# Patient Record
Sex: Male | Born: 1995 | Race: Black or African American | Hispanic: No | Marital: Single | Smoking: Never smoker
Health system: Southern US, Community
[De-identification: ages and names within clinical notes are randomized; demographics above are authoritative.]

## PROBLEM LIST (undated history)

## (undated) DIAGNOSIS — J45909 Unspecified asthma, uncomplicated: Secondary | ICD-10-CM

## (undated) HISTORY — PX: ABDOMINAL SURGERY: SHX537

---

## 2020-05-06 ENCOUNTER — Emergency Department (HOSPITAL_COMMUNITY)
Admission: EM | Admit: 2020-05-06 | Discharge: 2020-05-06 | Disposition: A | Discharge status: Home / Self Care | Payer: BC Managed Care – PPO | Attending: Emergency Medicine | Admitting: Emergency Medicine

## 2020-05-06 ENCOUNTER — Emergency Department (HOSPITAL_COMMUNITY): Payer: BC Managed Care – PPO

## 2020-05-06 ENCOUNTER — Encounter (HOSPITAL_COMMUNITY): Payer: Self-pay | Admitting: Emergency Medicine

## 2020-05-06 DIAGNOSIS — M545 Low back pain: Secondary | ICD-10-CM | POA: Insufficient documentation

## 2020-05-06 DIAGNOSIS — M542 Cervicalgia: Secondary | ICD-10-CM | POA: Diagnosis present

## 2020-05-06 DIAGNOSIS — M25562 Pain in left knee: Secondary | ICD-10-CM | POA: Insufficient documentation

## 2020-05-06 MED ORDER — KETOROLAC TROMETHAMINE 15 MG/ML IJ SOLN
15.0000 mg | Freq: Once | INTRAMUSCULAR | Status: AC
  Administered 2020-05-06: 15 mg via INTRAVENOUS
  Filled 2020-05-06: qty 1
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
## 2020-05-06 NOTE — ED Notes (Signed)
Charge RN made aware that family members upset they could not switch out to visit patient. Patient and family had been informed of visitor policy by myself and security. SO remained at bedside.
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
## 2020-07-21 ENCOUNTER — Ambulatory Visit (HOSPITAL_COMMUNITY)
Admission: EM | Admit: 2020-07-21 | Discharge: 2020-07-21 | Disposition: A | Discharge status: Home / Self Care | Payer: BC Managed Care – PPO | Attending: Family Medicine | Admitting: Family Medicine

## 2020-07-21 ENCOUNTER — Encounter (HOSPITAL_COMMUNITY): Payer: Self-pay | Admitting: Family Medicine

## 2020-07-21 ENCOUNTER — Other Ambulatory Visit: Payer: Self-pay

## 2020-07-21 DIAGNOSIS — S161XXA Strain of muscle, fascia and tendon at neck level, initial encounter: Secondary | ICD-10-CM

## 2020-07-21 MED ORDER — CYCLOBENZAPRINE HCL 10 MG PO TABS
ORAL_TABLET | ORAL | 0 refills | Status: DC
Start: 2020-07-21 — End: 2021-08-08

## 2020-07-21 MED ORDER — DICLOFENAC SODIUM 75 MG PO TBEC
75.0000 mg | DELAYED_RELEASE_TABLET | Freq: Two times a day (BID) | ORAL | 0 refills | Status: AC
Start: 2020-07-21 — End: ?
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
## 2021-08-07 ENCOUNTER — Emergency Department (HOSPITAL_COMMUNITY): Payer: BC Managed Care – PPO

## 2021-08-07 ENCOUNTER — Emergency Department (HOSPITAL_COMMUNITY)
Admission: EM | Admit: 2021-08-07 | Discharge: 2021-08-08 | Disposition: A | Discharge status: Home / Self Care | Payer: BC Managed Care – PPO | Attending: Physician Assistant | Admitting: Physician Assistant

## 2021-08-07 DIAGNOSIS — Z5321 Procedure and treatment not carried out due to patient leaving prior to being seen by health care provider: Secondary | ICD-10-CM | POA: Insufficient documentation

## 2021-08-07 DIAGNOSIS — R079 Chest pain, unspecified: Secondary | ICD-10-CM | POA: Insufficient documentation

## 2021-08-07 DIAGNOSIS — M25531 Pain in right wrist: Secondary | ICD-10-CM | POA: Insufficient documentation

## 2021-08-07 LAB — BASIC METABOLIC PANEL
Anion gap: 8 (ref 5–15)
BUN: 8 mg/dL (ref 6–20)
CO2: 28 mmol/L (ref 22–32)
Calcium: 9 mg/dL (ref 8.9–10.3)
Chloride: 102 mmol/L (ref 98–111)
Creatinine, Ser: 0.91 mg/dL (ref 0.61–1.24)
GFR, Estimated: >60 mL/min (ref >60)
Glucose, Bld: 68 mg/dL — ABNORMAL LOW (ref 70–99)
Potassium: 3.7 mmol/L (ref 3.5–5.1)
Sodium: 138 mmol/L (ref 135–145)

## 2021-08-07 LAB — CBC
HCT: 47.6 % (ref 39.0–52.0)
Hemoglobin: 15 g/dL (ref 13.0–17.0)
MCH: 26.4 pg (ref 26.0–34.0)
MCHC: 31.5 g/dL (ref 30.0–36.0)
MCV: 83.8 fL (ref 80.0–100.0)
Platelets: 206 10*3/uL (ref 150–400)
RBC: 5.68 MIL/uL (ref 4.22–5.81)
RDW: 12.7 % (ref 11.5–15.5)
WBC: 4.7 10*3/uL (ref 4.0–10.5)
nRBC: 0 % (ref 0.0–0.2)

## 2021-08-07 LAB — TROPONIN I (HIGH SENSITIVITY): Troponin I (High Sensitivity): 2 ng/L (ref <18)

## 2021-08-07 NOTE — ED Provider Notes (Signed)
Emergency Medicine Provider Triage Evaluation Note  Leonard Davis , a 25 y.o. male  was evaluated in triage.  Pt complains of chest pain that occurred while working yesterday. He is also c/o left arm pain and right wrist pain.  Review of Systems  Positive: Chest pain, left arm pain, right wrist pain Negative: sob  Physical Exam  BP 132/78 (BP Location: Right Arm)   Pulse (!) 59   Temp 98.4 F (36.9 C) (Oral)   Resp 18   SpO2 100%  Gen:   Awake, no distress   Resp:  Normal effort  MSK:   Moves extremities without difficulty   Medical Decision Making  Medically screening exam initiated at 8:50 PM.  Appropriate orders placed.  Leonard Davis was informed that the remainder of the evaluation will be completed by another provider, this initial triage assessment does not replace that evaluation, and the importance of remaining in the ED until their evaluation is complete.     Samson Frederic, Thomasina Housley S, PA-C 08/07/21 2050    Melene Plan, DO 08/07/21 2247

## 2021-08-07 NOTE — ED Triage Notes (Signed)
Pt c/o R wrist pain x "awhile," worse w movement. L bicep x1 day, cramping & "still feels tight today." CP yesterday, "wanted to bring it up," sharp pain x "a couple minutes, then faded."

## 2021-08-08 ENCOUNTER — Encounter (HOSPITAL_COMMUNITY): Payer: Self-pay

## 2021-08-08 ENCOUNTER — Ambulatory Visit (HOSPITAL_COMMUNITY)
Admission: EM | Admit: 2021-08-08 | Discharge: 2021-08-08 | Disposition: A | Discharge status: Home / Self Care | Payer: BC Managed Care – PPO | Attending: Family Medicine | Admitting: Family Medicine

## 2021-08-08 DIAGNOSIS — R079 Chest pain, unspecified: Secondary | ICD-10-CM | POA: Diagnosis not present

## 2021-08-08 DIAGNOSIS — R252 Cramp and spasm: Secondary | ICD-10-CM

## 2021-08-08 HISTORY — DX: Unspecified asthma, uncomplicated: J45.909

## 2021-08-08 LAB — TROPONIN I (HIGH SENSITIVITY): Troponin I (High Sensitivity): 3 ng/L (ref <18)

## 2021-08-08 MED ORDER — CYCLOBENZAPRINE HCL 5 MG PO TABS: 5.0000 mg | ORAL_TABLET | Freq: Every evening | ORAL | 0 refills | Status: AC | PRN

## 2021-08-08 MED ORDER — PREDNISONE 20 MG PO TABS: 20.0000 mg | ORAL_TABLET | Freq: Every day | ORAL | 0 refills | Status: AC

## 2021-08-08 NOTE — ED Provider Notes (Signed)
[facility]    CSN: [id number] Arrival date & time: 08/08/21  1329      History   Chief Complaint Chief Complaint  Patient presents with   Muscle Pain    HPI Leonard Davis is a 25 y.o. male.   Patient presenting today with 2-day history of left bicep cramping now with radiating pain up into left pectoral muscle.  Also having right wrist pain for the past 2 months.  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  Denies swelling, discoloration, numbness, tingling, weakness, shortness of breath, wheezing, headaches, dizziness, palpitations.  Has not been tried anything over-the-counter for symptoms so far.  Went to the emergency department yesterday, was seen in triage and EKG, chest x-ray and labs done but he ultimately left without being seen.   Past Medical History:  Diagnosis Date   Asthma     There are no problems to display for this patient.   Past Surgical History:  Procedure Laterality Date   ABDOMINAL SURGERY        Home Medications    Prior to Admission medications   Medication Sig Start Date End Date Taking? Authorizing Provider  cyclobenzaprine (FLEXERIL) 5 MG tablet Take 1 tablet (5 mg total) by mouth at bedtime as needed for muscle spasms. 08/08/21  Yes Particia Nearing, PA-C  predniSONE (DELTASONE) 20 MG tablet Take 1 tablet (20 mg total) by mouth daily with breakfast. 08/08/21  Yes Particia Nearing, PA-C  diclofenac (VOLTAREN) 75 MG EC tablet Take 1 tablet (75 mg total) by mouth 2 (two) times daily. 07/21/20   Mardella Layman, MD    Family History Family History  Problem Relation Age of Onset   Hypertension Mother    Healthy Father     Social History Social History   Tobacco Use   Smoking status: Never   Smokeless tobacco: Never  Vaping Use   Vaping Use: Some days   Substances: THC, Flavoring  Substance Use Topics   Alcohol use: Yes    Comment: Socially   Drug use: Yes    Types:  Marijuana    Comment: socially     Allergies   Patient has no known allergies.   Review of Systems Review of Systems Per HPI  Physical Exam Triage Vital Signs ED Triage Vitals  Enc Vitals Group     BP 08/08/21 1529 136/79     Pulse Rate 08/08/21 1529 69     Resp 08/08/21 1529 18     Temp 08/08/21 1529 97.9 F (36.6 C)     Temp Source 08/08/21 1529 Oral     SpO2 08/08/21 1529 97 %     Weight --      Height --      Head Circumference --      Peak Flow --      Pain Score 08/08/21 1525 0     Pain Loc --      Pain Edu? --      Excl. in GC? --    No data found.  Updated Vital Signs BP 136/79 (BP Location: Left Arm)   Pulse 69   Temp 97.9 F (36.6 C) (Oral)   Resp 18   SpO2 97%   Visual Acuity Right Eye Distance:   Left Eye Distance:   Bilateral Distance:    Right Eye Near:   Left Eye Near:    Bilateral Near:     Physical Exam  Vitals and nursing note reviewed.  Constitutional:      Appearance: Normal appearance.  HENT:     Head: Atraumatic.     Mouth/Throat:     Mouth: Mucous membranes are moist.  Eyes:     Extraocular Movements: Extraocular movements intact.     Conjunctiva/sclera: Conjunctivae normal.  Cardiovascular:     Rate and Rhythm: Normal rate and regular rhythm.  Pulmonary:     Effort: Pulmonary effort is normal.     Breath sounds: Normal breath sounds. No wheezing or rales.  Abdominal:     General: Bowel sounds are normal.     Palpations: Abdomen is soft.  Musculoskeletal:        General: Tenderness present. No swelling or deformity. Normal range of motion.     Cervical back: Normal range of motion and neck supple.     Comments: Tender to palpation left bicep region extending into left pectoral muscle, worse with range of motion against resistance.  Tenderness palpation flexor surface of right the range of motion full and intact in right wrist and hand, grip strength full and equal bilateral hands  Skin:    General: Skin is warm and dry.      Findings: No bruising or erythema.  Neurological:     General: No focal deficit present.     Mental Status: He is oriented to person, place, and time.     Motor: No weakness.     Gait: Gait normal.     Comments: Bilateral upper extremities neurovascular intact  Psychiatric:        Mood and Affect: Mood normal.        Thought Content: Thought content normal.        Judgment: Judgment normal.     UC Treatments / Results  Labs (all labs ordered are listed, but only abnormal results are displayed) Labs Reviewed - No data to display  EKG   Radiology DG Chest 2 View  Result Date: 08/07/2021 CLINICAL DATA:  Chest pain. EXAM: CHEST - 2 VIEW COMPARISON:  None. FINDINGS: The heart size and mediastinal contours are within normal limits. Both lungs are clear. The visualized skeletal structures are unremarkable. IMPRESSION: No active cardiopulmonary disease. Electronically Signed   By: Lupita Raider M.D.   On: 08/07/2021 21:34    Procedures Procedures (including critical care time)  Medications Ordered in UC Medications - No data to display  Initial Impression / Assessment and Plan / UC Course  I have reviewed the triage vital signs and the nursing notes.  Pertinent labs & imaging results that were available during my care of the patient were reviewed by me and considered in my medical decision making (see chart for details).     Consistent with muscular strains.  We will treat with prednisone, Flexeril, stretches, massage.  Work note given for rest.  Labs, chest x-ray, EKG all reviewed from the emergency department triage yesterday and all reassuring without significant abnormal findings.  Final Clinical Impressions(s) / UC Diagnoses   Final diagnoses:  Muscle cramping  Chest pain, unspecified type   Discharge Instructions   None    ED Prescriptions     Medication Sig Dispense Auth. Provider   predniSONE (DELTASONE) 20 MG tablet Take 1 tablet (20 mg total) by mouth  daily with breakfast. 10 tablet Particia Nearing, PA-C   cyclobenzaprine (FLEXERIL) 5 MG tablet Take 1 tablet (5 mg total) by mouth at bedtime as needed for muscle spasms. 10 tablet Maurice March,  Salley Hews, PA-C      PDMP not reviewed this encounter.   Particia Nearing, [place] 08/08/21 2118

## 2021-08-08 NOTE — ED Triage Notes (Signed)
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  Cramping went up into L chest wall.  Lasted approx 20 seconds.  Was hard to bend arm again for a while without the cramping starting again.  Also reports R wrist pain x 2 mos.  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

## 2021-08-08 NOTE — ED Notes (Signed)
PT LEFT DUE TO WAIT TIME 
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
## 2023-02-12 IMAGING — DX DG CHEST 2V
2 series · 2 of 2 positions shown · non-contrast
Comparison: None.

CLINICAL DATA: Chest pain.

EXAM:
CHEST - 2 VIEW

[w chest pa]
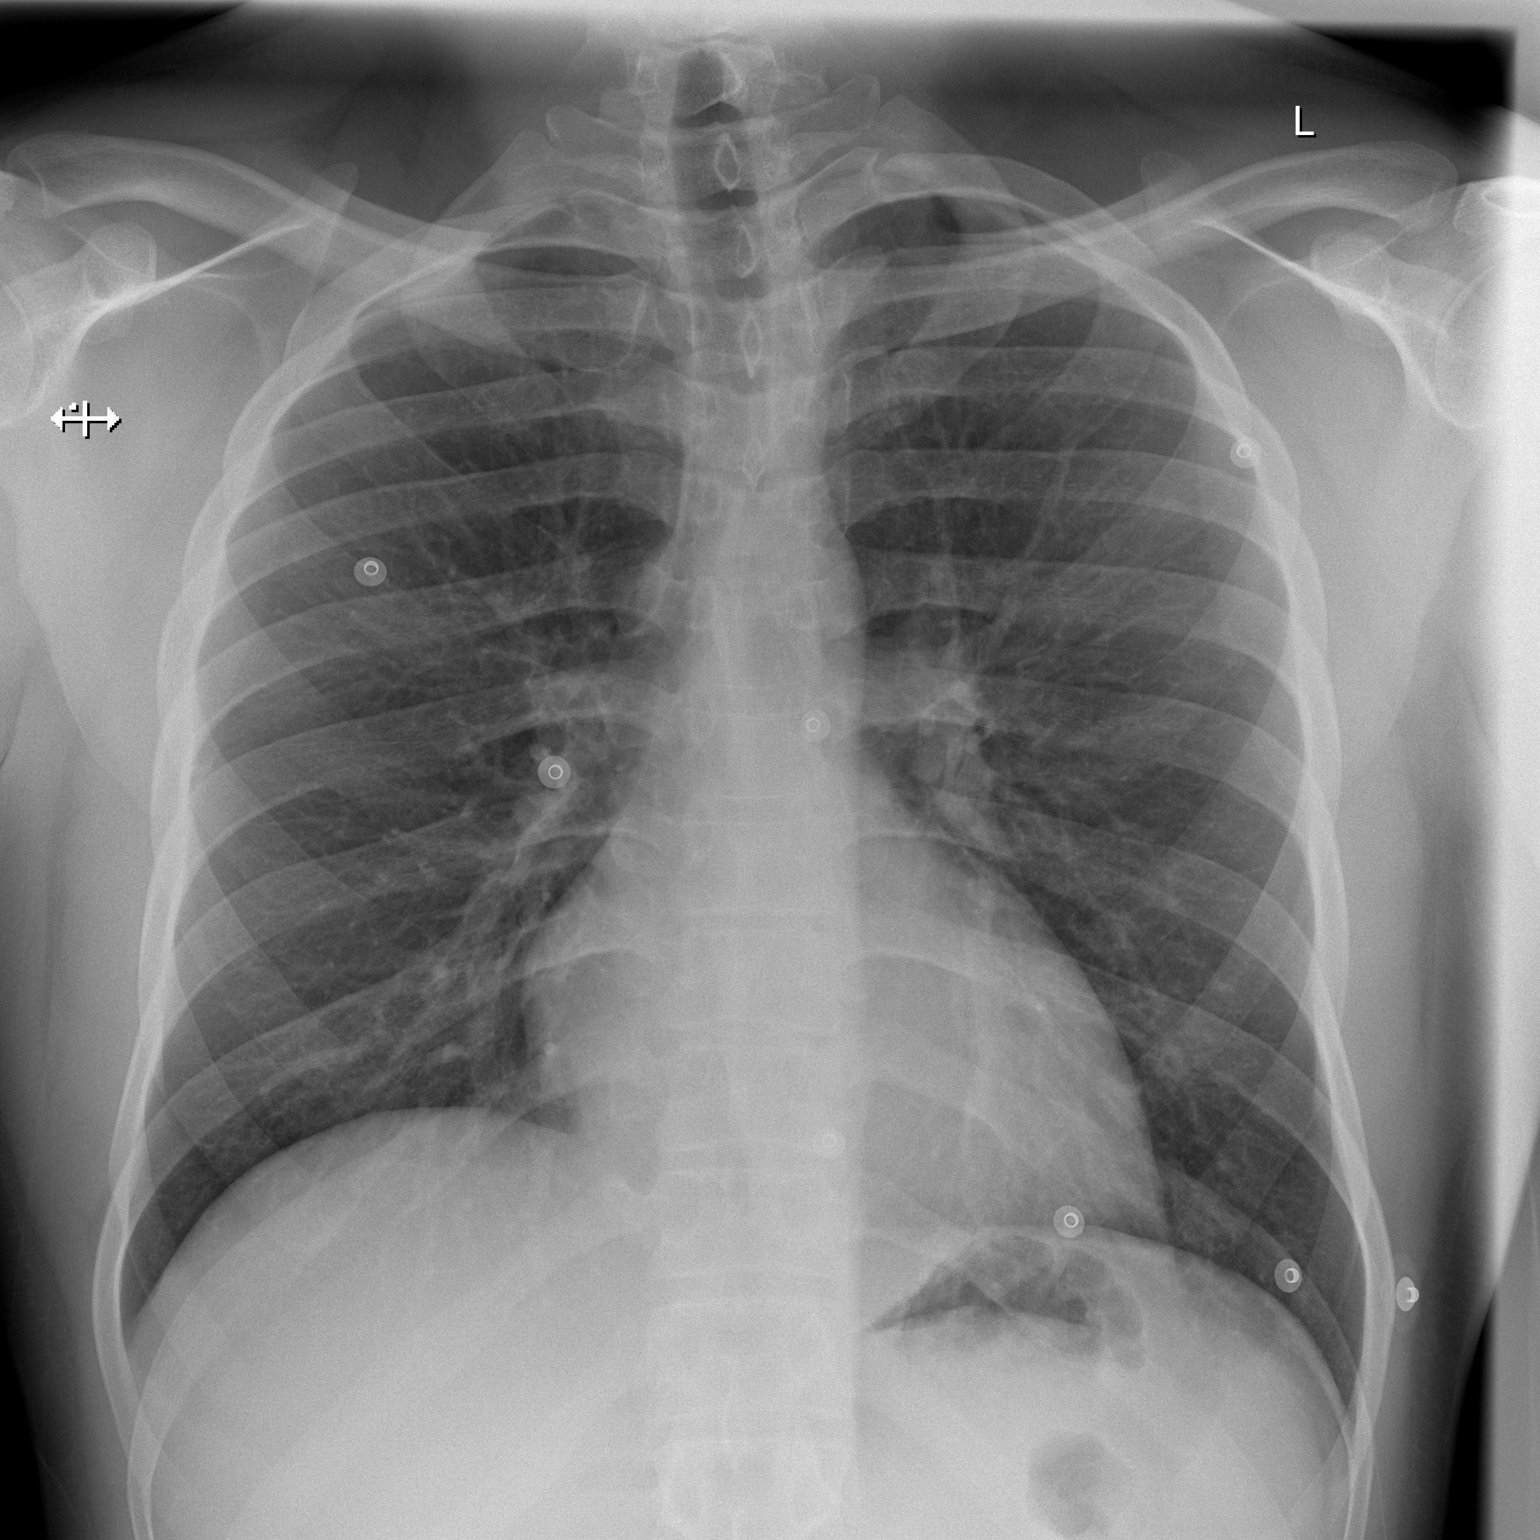

[w chest lat]
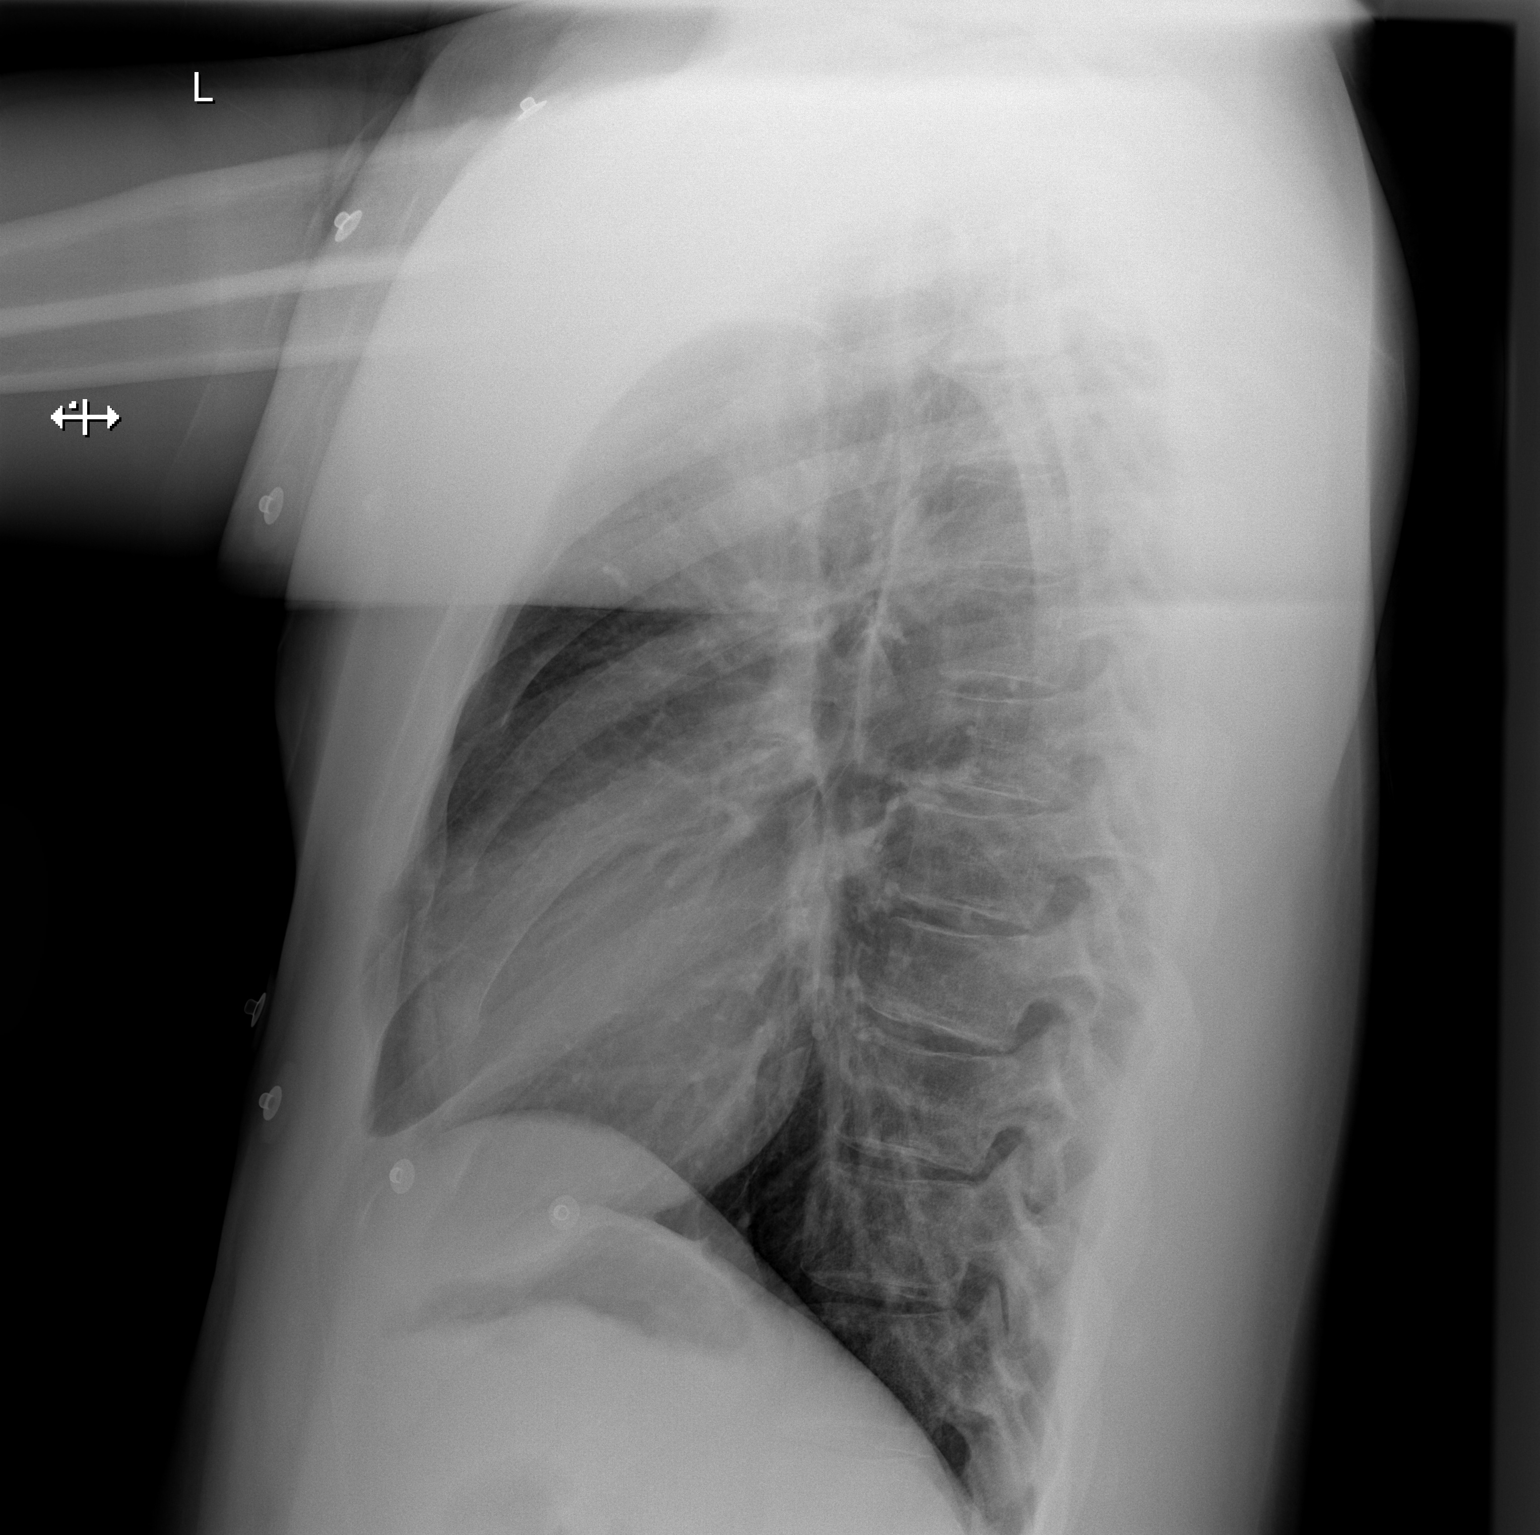

[2 of 2 positions shown; findings below may reference images not displayed]

FINDINGS: The heart size and mediastinal contours are within normal limits.
Both lungs are clear. The visualized skeletal structures are
unremarkable.
IMPRESSION: No active cardiopulmonary disease.

## 2023-02-21 ENCOUNTER — Encounter (HOSPITAL_COMMUNITY): Payer: Self-pay | Admitting: Emergency Medicine

## 2023-02-21 ENCOUNTER — Emergency Department (HOSPITAL_COMMUNITY): Payer: 59

## 2023-02-21 ENCOUNTER — Other Ambulatory Visit: Payer: Self-pay

## 2023-02-21 ENCOUNTER — Emergency Department (HOSPITAL_COMMUNITY)
Admission: EM | Admit: 2023-02-21 | Discharge: 2023-02-22 | Disposition: A | Discharge status: Home / Self Care | Payer: 59 | Attending: Emergency Medicine | Admitting: Emergency Medicine

## 2023-02-21 DIAGNOSIS — M5431 Sciatica, right side: Secondary | ICD-10-CM | POA: Insufficient documentation

## 2023-02-21 DIAGNOSIS — K649 Unspecified hemorrhoids: Secondary | ICD-10-CM | POA: Insufficient documentation

## 2023-02-21 DIAGNOSIS — M79604 Pain in right leg: Secondary | ICD-10-CM | POA: Diagnosis present

## 2023-02-21 LAB — POC OCCULT BLOOD, ED: Fecal Occult Bld: NEGATIVE

## 2023-02-21 LAB — URINALYSIS, ROUTINE W REFLEX MICROSCOPIC
Bilirubin Urine: NEGATIVE
Glucose, UA: NEGATIVE mg/dL
Hgb urine dipstick: NEGATIVE
Ketones, ur: NEGATIVE mg/dL
Leukocytes,Ua: NEGATIVE
Nitrite: NEGATIVE
Protein, ur: NEGATIVE mg/dL
Specific Gravity, Urine: 1.004 — ABNORMAL LOW (ref 1.005–1.030)
pH: 6 (ref 5.0–8.0)

## 2023-02-21 LAB — COMPREHENSIVE METABOLIC PANEL
ALT: 22 U/L (ref 0–44)
AST: 32 U/L (ref 15–41)
Albumin: 4.2 g/dL (ref 3.5–5.0)
Alkaline Phosphatase: 70 U/L (ref 38–126)
Anion gap: 12 (ref 5–15)
BUN: 5 mg/dL — ABNORMAL LOW (ref 6–20)
CO2: 25 mmol/L (ref 22–32)
Calcium: 9.1 mg/dL (ref 8.9–10.3)
Chloride: 101 mmol/L (ref 98–111)
Creatinine, Ser: 0.97 mg/dL (ref 0.61–1.24)
GFR, Estimated: >60 mL/min (ref >60)
Glucose, Bld: 105 mg/dL — ABNORMAL HIGH (ref 70–99)
Potassium: 3.8 mmol/L (ref 3.5–5.1)
Sodium: 138 mmol/L (ref 135–145)
Total Bilirubin: 0.9 mg/dL (ref 0.3–1.2)
Total Protein: 6.9 g/dL (ref 6.5–8.1)

## 2023-02-21 LAB — CBC WITH DIFFERENTIAL/PLATELET
Abs Immature Granulocytes: 0.01 10*3/uL (ref 0.00–0.07)
Basophils Absolute: 0 10*3/uL (ref 0.0–0.1)
Basophils Relative: 1 %
Eosinophils Absolute: 0.2 10*3/uL (ref 0.0–0.5)
Eosinophils Relative: 4 %
HCT: 43.7 % (ref 39.0–52.0)
Hemoglobin: 14.6 g/dL (ref 13.0–17.0)
Immature Granulocytes: 0 %
Lymphocytes Relative: 49 %
Lymphs Abs: 2.3 10*3/uL (ref 0.7–4.0)
MCH: 27.6 pg (ref 26.0–34.0)
MCHC: 33.4 g/dL (ref 30.0–36.0)
MCV: 82.6 fL (ref 80.0–100.0)
Monocytes Absolute: 0.3 10*3/uL (ref 0.1–1.0)
Monocytes Relative: 5 %
Neutro Abs: 1.9 10*3/uL (ref 1.7–7.7)
Neutrophils Relative %: 41 %
Platelets: 203 10*3/uL (ref 150–400)
RBC: 5.29 MIL/uL (ref 4.22–5.81)
RDW: 12.8 % (ref 11.5–15.5)
WBC: 4.6 10*3/uL (ref 4.0–10.5)
nRBC: 0 % (ref 0.0–0.2)

## 2023-02-21 LAB — LIPASE, BLOOD: Lipase: 31 U/L (ref 11–51)

## 2023-02-21 MED ORDER — ONDANSETRON 4 MG PO TBDP
8.0000 mg | ORAL_TABLET | Freq: Once | ORAL | Status: AC
  Administered 2023-02-22: 8 mg via ORAL
  Filled 2023-02-21: qty 2

## 2023-02-21 MED ORDER — KETOROLAC TROMETHAMINE 30 MG/ML IJ SOLN
30.0000 mg | Freq: Once | INTRAMUSCULAR | Status: AC
  Administered 2023-02-22: 30 mg via INTRAMUSCULAR
  Filled 2023-02-21: qty 1

## 2023-02-21 NOTE — ED Provider Triage Note (Signed)
Emergency Medicine Provider Triage Evaluation Note  Leonard Davis , a 27 y.o. male  was evaluated in triage.  Pt complains of right lower extremity pain and hematochezia.  Also complaining of epistaxis.  Has had 2 episodes today.  Denies trauma to the right lower extremity.  Pain is localized to the lateral portion of his right hip with occasional radiation to the lateral aspect of the right knee.  Also endorses nausea.  Denies abdominal pain.  He is having normal bowel movements.  Review of Systems  Positive: As above Negative: As above  Physical Exam  BP 137/74 (BP Location: Right Arm)   Pulse 72   Temp 98.4 F (36.9 C) (Oral)   Resp 16   Ht 6\' 5"  (1.956 m)   Wt 122.5 kg   SpO2 98%   BMI 32.02 kg/m  Gen:   Awake, no distress   Resp:  Normal effort  MSK:   Moves extremities without difficulty  Other:    Medical Decision Making  Medically screening exam initiated at 9:20 PM.  Appropriate orders placed.  Orville Greth was informed that the remainder of the evaluation will be completed by another provider, this initial triage assessment does not replace that evaluation, and the importance of remaining in the ED until their evaluation is complete.  Labs ordered   Nehemiah Massed 02/21/23 2121

## 2023-02-21 NOTE — ED Provider Notes (Signed)
[place] EMERGENCY DEPARTMENT AT [facility] Provider Note   CSN: [id number] Arrival date & time: 02/21/23  2100     History  Chief Complaint  Patient presents with   Nausea   Leg Pain    Leonard Davis is a 27 y.o. male.  The history is provided by the patient.  Leg Pain Location:  Buttock Buttock location:  R buttock Pain details:    Quality:  Shooting and cramping   Radiates to:  R leg   Severity:  Moderate   Onset quality:  Sudden   Timing:  Constant   Progression:  Unchanged Chronicity:  Recurrent Relieved by:  Nothing Worsened by:  Nothing Ineffective treatments:  None tried Associated symptoms: no fever   Risk factors: no concern for non-accidental trauma   Patient also having nausea and was straining to have a BM and noticed BRB, it was in the bowl and on the toilet paper.       Home Medications Prior to Admission medications   Medication Sig Start Date End Date Taking? Authorizing Provider  cyclobenzaprine (FLEXERIL) 5 MG tablet Take 1 tablet (5 mg total) by mouth at bedtime as needed for muscle spasms. 08/08/21   Particia Nearing, PA-C  diclofenac (VOLTAREN) 75 MG EC tablet Take 1 tablet (75 mg total) by mouth 2 (two) times daily. 07/21/20   Mardella Layman, MD  predniSONE (DELTASONE) 20 MG tablet Take 1 tablet (20 mg total) by mouth daily with breakfast. 08/08/21   Particia Nearing, PA-C      Allergies    Patient has no known allergies.    Review of Systems   Review of Systems  Constitutional:  Negative for fever.  HENT:  Negative for facial swelling.   Eyes:  Negative for redness.  Respiratory:  Negative for wheezing and stridor.   Cardiovascular:  Negative for leg swelling.  Gastrointestinal:  Positive for anal bleeding and nausea. Negative for vomiting.  All other systems reviewed and are negative.   Physical Exam Updated Vital Signs BP 137/74 (BP Location: Right Arm)   Pulse 72   Temp 98.4 F (36.9 C) (Oral)   Resp  16   Ht 6\' 5"  (1.956 m)   Wt 122.5 kg   SpO2 98%   BMI 32.02 kg/m  Physical Exam Vitals and nursing note reviewed. Exam conducted with a chaperone present.  Constitutional:      General: He is not in acute distress.    Appearance: Normal appearance. He is well-developed. He is not diaphoretic.  HENT:     Head: Normocephalic and atraumatic.     Nose: Nose normal.  Eyes:     Conjunctiva/sclera: Conjunctivae normal.     Pupils: Pupils are equal, round, and reactive to light.  Cardiovascular:     Rate and Rhythm: Normal rate and regular rhythm.  Pulmonary:     Effort: Pulmonary effort is normal.     Breath sounds: Normal breath sounds. No wheezing or rales.  Abdominal:     General: Bowel sounds are normal.     Palpations: Abdomen is soft.     Tenderness: There is no abdominal tenderness. There is no guarding or rebound.  Genitourinary:    Comments: Hemorrhoid with bleeding  Musculoskeletal:        General: Normal range of motion.     Cervical back: Normal range of motion and neck supple.  Skin:    General: Skin is warm and dry.     Capillary Refill:  Capillary refill takes less than 2 seconds.  Neurological:     General: No focal deficit present.     Mental Status: He is alert and oriented to person, place, and time.     Deep Tendon Reflexes: Reflexes normal.  Psychiatric:        Mood and Affect: Mood normal.        Behavior: Behavior normal.     ED Results / Procedures / Treatments   Labs (all labs ordered are listed, but only abnormal results are displayed) Results for orders placed or performed during the hospital encounter of 02/21/23  CBC with Differential  Result Value Ref Range   WBC 4.6 4.0 - 10.5 K/uL   RBC 5.29 4.22 - 5.81 MIL/uL   Hemoglobin 14.6 13.0 - 17.0 g/dL   HCT 40.9 81.1 - 91.4 %   MCV 82.6 80.0 - 100.0 fL   MCH 27.6 26.0 - 34.0 pg   MCHC 33.4 30.0 - 36.0 g/dL   RDW 78.2 95.6 - 21.3 %   Platelets 203 150 - 400 K/uL   nRBC 0.0 0.0 - 0.2 %    Neutrophils Relative % 41 %   Neutro Abs 1.9 1.7 - 7.7 K/uL   Lymphocytes Relative 49 %   Lymphs Abs 2.3 0.7 - 4.0 K/uL   Monocytes Relative 5 %   Monocytes Absolute 0.3 0.1 - 1.0 K/uL   Eosinophils Relative 4 %   Eosinophils Absolute 0.2 0.0 - 0.5 K/uL   Basophils Relative 1 %   Basophils Absolute 0.0 0.0 - 0.1 K/uL   Immature Granulocytes 0 %   Abs Immature Granulocytes 0.01 0.00 - 0.07 K/uL  Comprehensive metabolic panel  Result Value Ref Range   Sodium 138 135 - 145 mmol/L   Potassium 3.8 3.5 - 5.1 mmol/L   Chloride 101 98 - 111 mmol/L   CO2 25 22 - 32 mmol/L   Glucose, Bld 105 (H) 70 - 99 mg/dL   BUN 5 (L) 6 - 20 mg/dL   Creatinine, Ser 0.86 0.61 - 1.24 mg/dL   Calcium 9.1 8.9 - 57.8 mg/dL   Total Protein 6.9 6.5 - 8.1 g/dL   Albumin 4.2 3.5 - 5.0 g/dL   AST 32 15 - 41 U/L   ALT 22 0 - 44 U/L   Alkaline Phosphatase 70 38 - 126 U/L   Total Bilirubin 0.9 0.3 - 1.2 mg/dL   GFR, Estimated >46 >96 mL/min   Anion gap 12 5 - 15  Lipase, blood  Result Value Ref Range   Lipase 31 11 - 51 U/L  Urinalysis, Routine w reflex microscopic -Urine, Clean Catch  Result Value Ref Range   Color, Urine STRAW (A) YELLOW   APPearance CLEAR CLEAR   Specific Gravity, Urine 1.004 (L) 1.005 - 1.030   pH 6.0 5.0 - 8.0   Glucose, UA NEGATIVE NEGATIVE mg/dL   Hgb urine dipstick NEGATIVE NEGATIVE   Bilirubin Urine NEGATIVE NEGATIVE   Ketones, ur NEGATIVE NEGATIVE mg/dL   Protein, ur NEGATIVE NEGATIVE mg/dL   Nitrite NEGATIVE NEGATIVE   Leukocytes,Ua NEGATIVE NEGATIVE  POC occult blood, ED  Result Value Ref Range   Fecal Occult Bld NEGATIVE NEGATIVE   No results found.  Radiology No results found.  Procedures Procedures    Medications Ordered in ED Medications  ondansetron (ZOFRAN-ODT) disintegrating tablet 8 mg (has no administration in time range)  ketorolac (TORADOL) 30 MG/ML injection 30 mg (has no administration in time range)    ED  Course/ Medical Decision Making/ A&P                              Medical Decision Making Patient with sciatica of R buttock and lower extremity and bleeding with BP today   Problems Addressed: Hemorrhoids, unspecified hemorrhoid type:    Details: Sitz bath bowel regime and preparation H Sciatica of right side:    Details: Will start NSAIDs  Amount and/or Complexity of Data Reviewed External Data Reviewed: notes.    Details: Previous notes reviewed  Labs: ordered.    Details: All labs reviewed:  normal white count 4.6, normal hemoglobin 14.6, normal platelet count.  Normal sodium 138, Normal potassium 3.8, normal creatinine .97, normal LFTs, normal lipase.  Urine is negative for UTI Radiology: ordered and independent interpretation performed.    Details: KUB with stool by me   Risk Prescription drug management. Risk Details: Preparation H and sitz baths as well as daily miralax for hemorrhoids and to avoid straining which will cause hemorrhoids to flare.  NSAIDs and lidoderm for sciatica.  Follow up with PMD.  Strict return.      Final Clinical Impression(s) / ED Diagnoses Final diagnoses:  Sciatica of right side  Hemorrhoids, unspecified hemorrhoid type   Return for intractable cough, coughing up blood, fevers > 100.4 unrelieved by medication, shortness of breath, intractable vomiting, chest pain, shortness of breath, weakness, numbness, changes in speech, facial asymmetry, abdominal pain, passing out, Inability to tolerate liquids or food, cough, altered mental status or any concerns. No signs of systemic illness or infection. The patient is nontoxic-appearing on exam and vital signs are within normal limits.  I have reviewed the triage vital signs and the nursing notes. Pertinent labs & imaging results that were available during my care of the patient were reviewed by me and considered in my medical decision making (see chart for details). After history, exam, and medical workup I feel the patient has been appropriately  medically screened and is safe for discharge home. Pertinent diagnoses were discussed with the patient. Patient was given return precautions. Rx / DC Orders ED Discharge Orders     None         Yosiel Thieme, MD 02/22/23 5409

## 2023-02-21 NOTE — Discharge Instructions (Addendum)
Use preparation H on your bottom for hemohroid,  take sitz baths and to avoid straining use one capful of miralax daily.

## 2023-02-21 NOTE — ED Triage Notes (Addendum)
Pt c/o intermittent blood in stool and right leg pain x1 week. Denies recent injury. Denies blood thinners. Pt endorses nausea without vomiting that started today. Pt also endorses nose bleed today.

## 2023-02-22 ENCOUNTER — Emergency Department (HOSPITAL_COMMUNITY): Payer: 59

## 2023-02-22 MED ORDER — LIDOCAINE 5 % EX PTCH: 1.0000 | MEDICATED_PATCH | CUTANEOUS | 0 refills | Status: AC

## 2023-02-22 MED ORDER — IBUPROFEN 400 MG PO TABS: 400.0000 mg | ORAL_TABLET | Freq: Four times a day (QID) | ORAL | 0 refills | Status: AC | PRN

## 2023-04-15 ENCOUNTER — Ambulatory Visit: Payer: 59 | Admitting: Family Medicine

## 2023-04-25 ENCOUNTER — Emergency Department (HOSPITAL_BASED_OUTPATIENT_CLINIC_OR_DEPARTMENT_OTHER)
Admission: EM | Admit: 2023-04-25 | Discharge: 2023-04-25 | Disposition: A | Discharge status: Home / Self Care | Payer: 59 | Attending: Emergency Medicine | Admitting: Emergency Medicine

## 2023-04-25 ENCOUNTER — Encounter (HOSPITAL_BASED_OUTPATIENT_CLINIC_OR_DEPARTMENT_OTHER): Payer: Self-pay | Admitting: Emergency Medicine

## 2023-04-25 ENCOUNTER — Other Ambulatory Visit: Payer: Self-pay

## 2023-04-25 DIAGNOSIS — Y9389 Activity, other specified: Secondary | ICD-10-CM | POA: Diagnosis not present

## 2023-04-25 DIAGNOSIS — S61011A Laceration without foreign body of right thumb without damage to nail, initial encounter: Secondary | ICD-10-CM | POA: Diagnosis not present

## 2023-04-25 DIAGNOSIS — W268XXA Contact with other sharp object(s), not elsewhere classified, initial encounter: Secondary | ICD-10-CM | POA: Insufficient documentation

## 2023-04-25 DIAGNOSIS — S6991XA Unspecified injury of right wrist, hand and finger(s), initial encounter: Secondary | ICD-10-CM | POA: Diagnosis present

## 2023-04-25 MED ORDER — LIDOCAINE HCL 2 % IJ SOLN
5.0000 mL | Freq: Once | INTRAMUSCULAR | Status: AC
  Administered 2023-04-25: 100 mg
  Filled 2023-04-25: qty 20

## 2023-04-25 NOTE — ED Notes (Signed)
RN provided education on wound and suture care. Pt demonstrated teach back. Pt had no further questions.

## 2023-04-25 NOTE — Discharge Instructions (Signed)
Suture removal in 8 days  °

## 2023-04-25 NOTE — ED Triage Notes (Signed)
Patient arrived via POV c/o laceration to right hand at base of thumb. Patient states trying to open up can and cutting on can lid. Patient lac is approximately 1.5 cm. Bleeding well controlled. Patient is AO x 4, VS WDL, normal gait.

## 2023-04-25 NOTE — ED Provider Notes (Signed)
[place] EMERGENCY DEPARTMENT AT [facility] Provider Note   CSN: [id number] Arrival date & time: 04/25/23  2006     History  Chief Complaint  Patient presents with   Laceration    Leonard Davis is a 27 y.o. male.  Patient complains of a laceration to his right hand.  Patient cut his right finger on a metal can lid.  Reports his last tetanus shot was less than 5 years ago  The history is provided by the patient. No language interpreter was used.  Laceration Location:  Hand Hand laceration location:  R hand Length:  0.8 Depth:  Cutaneous Quality: straight   Bleeding: uncontrolled   Laceration mechanism:  Metal edge Pain details:    Quality:  Aching      Home Medications Prior to Admission medications   Medication Sig Start Date End Date Taking? Authorizing Provider  cyclobenzaprine (FLEXERIL) 5 MG tablet Take 1 tablet (5 mg total) by mouth at bedtime as needed for muscle spasms. 08/08/21   Particia Nearing, PA-C  diclofenac (VOLTAREN) 75 MG EC tablet Take 1 tablet (75 mg total) by mouth 2 (two) times daily. 07/21/20   Mardella Layman, MD  ibuprofen (ADVIL) 400 MG tablet Take 1 tablet (400 mg total) by mouth every 6 (six) hours as needed. 02/22/23   Palumbo, April, MD  lidocaine (LIDODERM) 5 % Place 1 patch onto the skin daily. Remove & Discard patch within 12 hours or as directed by MD 02/22/23   Nicanor Alcon, April, MD  predniSONE (DELTASONE) 20 MG tablet Take 1 tablet (20 mg total) by mouth daily with breakfast. 08/08/21   Particia Nearing, PA-C      Allergies    Patient has no known allergies.    Review of Systems   Review of Systems  All other systems reviewed and are negative.   Physical Exam Updated Vital Signs BP 125/78   Pulse 75   Temp 97.7 F (36.5 C)   Resp 16   Ht 6\' 5"  (1.956 m)   Wt 122.5 kg   SpO2 97%   BMI 32.02 kg/m  Physical Exam Constitutional:      Appearance: Normal appearance.  Skin:    Comments: 1.8 cm laceration  right hand palmar aspect at base of thumb MCP joint, full range of motion neurovascular neurosensory are intact  Neurological:     General: No focal deficit present.     Mental Status: He is alert.  Psychiatric:        Mood and Affect: Mood normal.     ED Results / Procedures / Treatments   Labs (all labs ordered are listed, but only abnormal results are displayed) Labs Reviewed - No data to display  EKG None  Radiology No results found.  Procedures .Marland KitchenLaceration Repair  Date/Time: 04/25/2023 9:50 PM  Performed by: Elson Areas, PA-C Authorized by: Elson Areas, PA-C   Consent:    Consent obtained:  Verbal   Consent given by:  Patient   Risks discussed:  Infection   Alternatives discussed:  No treatment Universal protocol:    Procedure explained and questions answered to patient or proxy's satisfaction: yes     Immediately prior to procedure, a time out was called: yes     Patient identity confirmed:  Verbally with patient Anesthesia:    Anesthesia method:  Local infiltration   Local anesthetic:  Lidocaine 2% w/o epi Laceration details:    Location:  Finger   Finger location:  R thumb   Length (cm):  0.8 Pre-procedure details:    Preparation:  Patient was prepped and draped in usual sterile fashion Exploration:    Wound exploration: wound explored through full range of motion     Contaminated: no   Treatment:    Area cleansed with:  Povidone-iodine   Amount of cleaning:  Standard   Irrigation solution:  Sterile saline Skin repair:    Repair method:  Sutures   Suture size:  5-0   Suture material:  Prolene   Suture technique:  Simple interrupted   Number of sutures:  2 Approximation:    Approximation:  Loose Repair type:    Repair type:  Simple Post-procedure details:    Procedure completion:  Tolerated     Medications Ordered in ED Medications  lidocaine (XYLOCAINE) 2 % (with pres) injection 100 mg (has no administration in time range)    ED  Course/ Medical Decision Making/ A&P                             Medical Decision Making Patient cut his right hand on a can lead  Risk OTC drugs. Risk Details: Patient advised Tylenol or ibuprofen suture removal in 8 days see procedure note           Final Clinical Impression(s) / ED Diagnoses Final diagnoses:  Laceration of right thumb, foreign body presence unspecified, nail damage status unspecified, initial encounter    Rx / DC Orders ED Discharge Orders     None      An After Visit Summary was printed and given to the patient.    Osie Cheeks 04/25/23 2152    Benjiman Core, MD 04/25/23 [phone]
